# Patient Record
Sex: Female | Born: 1935 | Race: White | Hispanic: No
Health system: Southern US, Community
[De-identification: ages and names within clinical notes are randomized; demographics above are authoritative.]

---

## 2006-01-23 ENCOUNTER — Emergency Department: Payer: Self-pay | Admitting: Internal Medicine

## 2006-06-03 ENCOUNTER — Ambulatory Visit: Payer: Self-pay | Admitting: Specialist

## 2007-02-22 ENCOUNTER — Other Ambulatory Visit: Payer: Self-pay

## 2007-02-22 ENCOUNTER — Ambulatory Visit: Payer: Self-pay | Admitting: Specialist

## 2007-03-11 ENCOUNTER — Inpatient Hospital Stay: Payer: Self-pay | Admitting: Specialist

## 2007-03-23 ENCOUNTER — Ambulatory Visit: Payer: Self-pay | Admitting: Internal Medicine

## 2007-04-13 ENCOUNTER — Other Ambulatory Visit: Payer: Self-pay

## 2007-04-13 ENCOUNTER — Inpatient Hospital Stay: Payer: Self-pay | Admitting: Internal Medicine

## 2007-05-03 ENCOUNTER — Ambulatory Visit: Payer: Self-pay | Admitting: Family Medicine

## 2007-05-04 ENCOUNTER — Ambulatory Visit: Payer: Self-pay | Admitting: Family Medicine

## 2007-05-12 ENCOUNTER — Inpatient Hospital Stay: Payer: Self-pay | Admitting: Internal Medicine

## 2007-05-19 ENCOUNTER — Inpatient Hospital Stay: Payer: Self-pay | Admitting: Internal Medicine

## 2007-05-19 ENCOUNTER — Other Ambulatory Visit: Payer: Self-pay

## 2007-06-02 ENCOUNTER — Other Ambulatory Visit: Payer: Self-pay

## 2007-06-02 ENCOUNTER — Observation Stay: Payer: Self-pay | Admitting: Internal Medicine

## 2007-10-13 ENCOUNTER — Ambulatory Visit: Payer: Self-pay | Admitting: Family Medicine

## 2008-03-03 ENCOUNTER — Ambulatory Visit: Payer: Self-pay | Admitting: Internal Medicine

## 2008-03-21 ENCOUNTER — Ambulatory Visit: Payer: Self-pay | Admitting: Unknown Physician Specialty

## 2009-03-27 ENCOUNTER — Ambulatory Visit: Payer: Self-pay | Admitting: Vascular Surgery

## 2009-04-24 ENCOUNTER — Inpatient Hospital Stay: Payer: Self-pay | Admitting: Internal Medicine

## 2009-08-21 IMAGING — XA IR VASCULAR PROCEDURE
10 of 12 series · 15 of 24 positions shown · non-contrast
Comparison: none

[Series 1: run · 2 of 40 slices shown (1 of 10)]
[im 1/40]
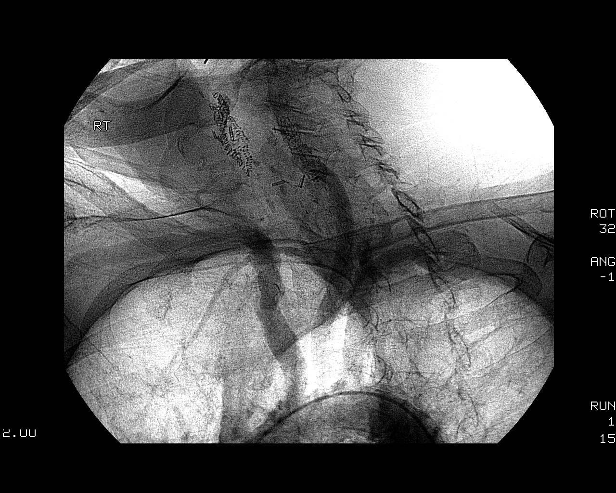
[im 40/40]
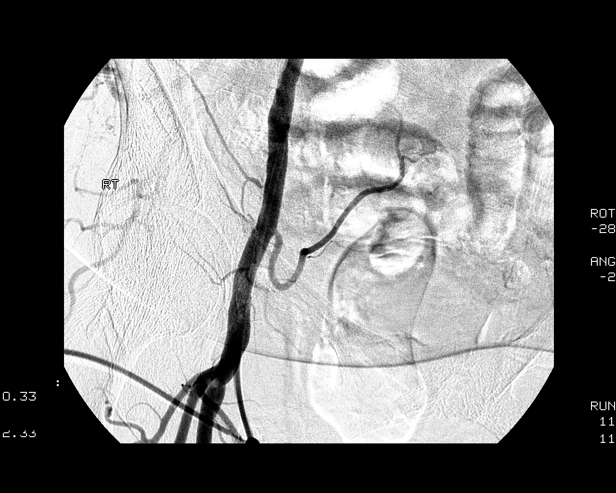

[Series 2: run · 1 of 13 slices shown (2 of 10)]
[im 1/13]
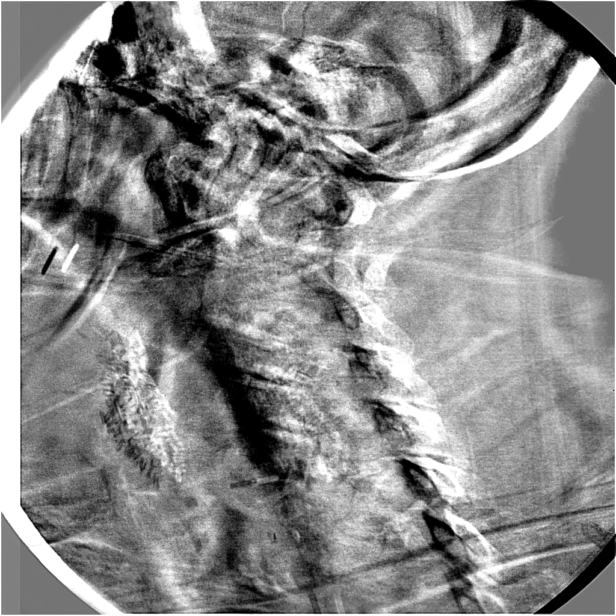

[Series 3: run · 1 of 12 slices shown (3 of 10)]
[im 1/12]
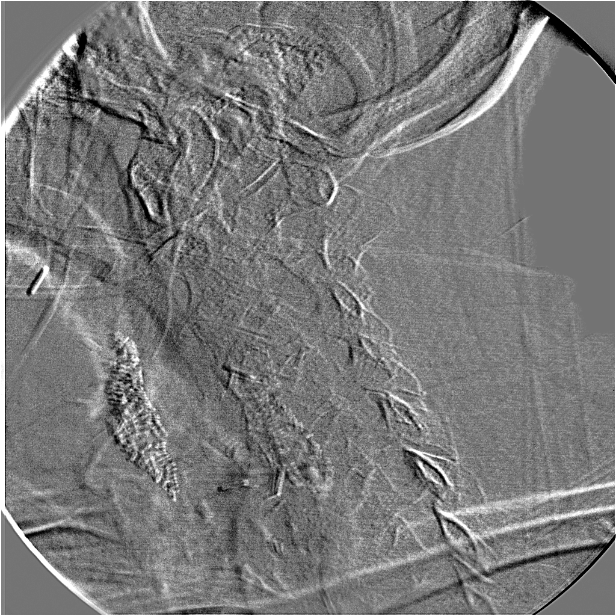

[Series 5: run · 1 of 11 slices shown (4 of 10)]
[im 1/11]
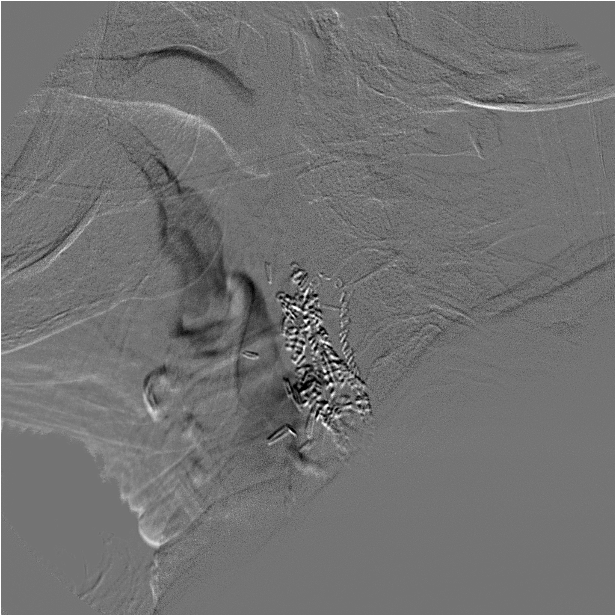

[Series 6: run · 2 of 35 slices shown (5 of 10)]
[im 1/35]
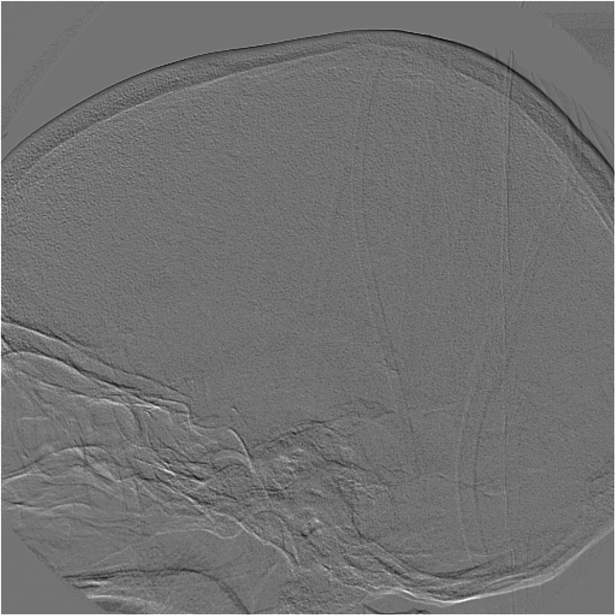
[im 35/35]
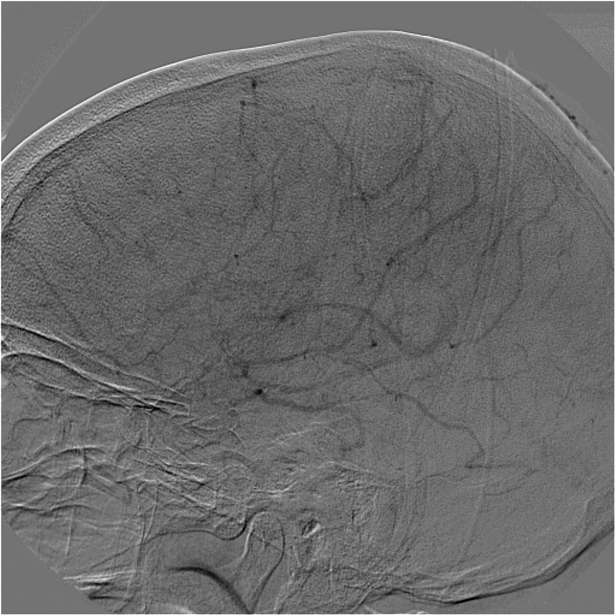

[Series 7: run · 2 of 49 slices shown (6 of 10)]
[im 17/49]
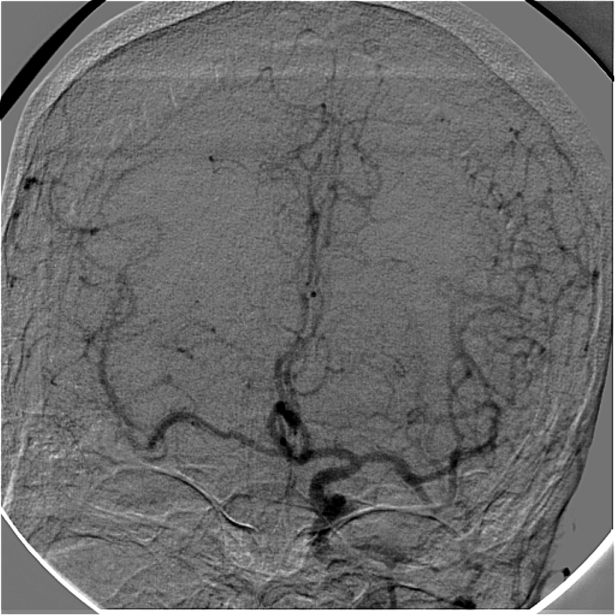
[im 33/49]
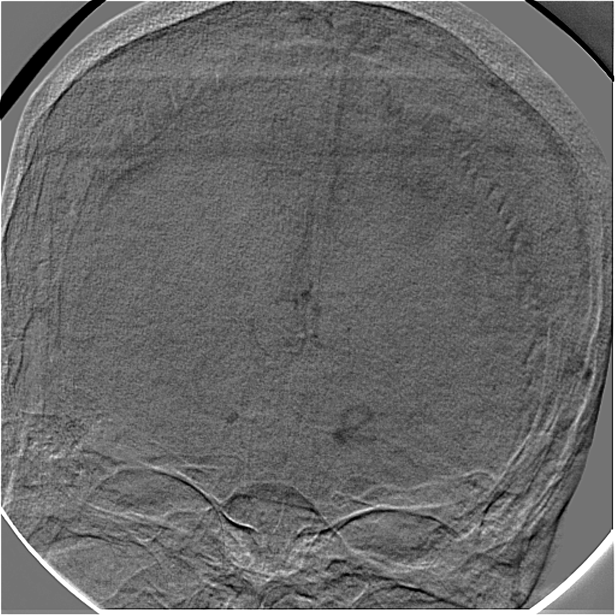

[Series 8: run · 2 of 42 slices shown (7 of 10)]
[im 1/42]
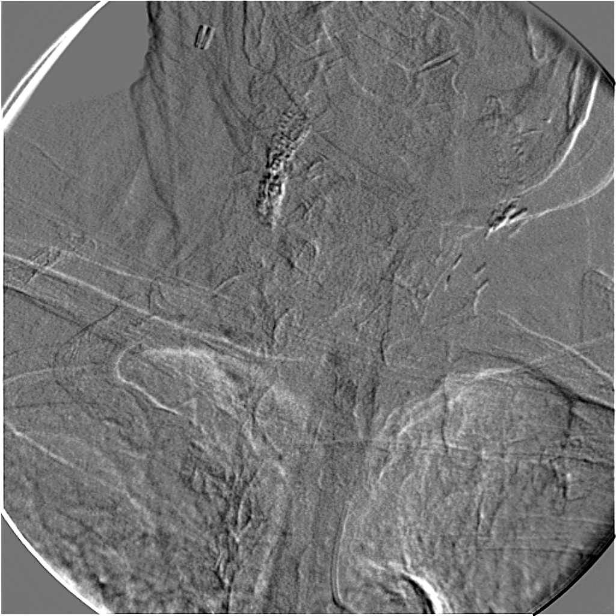
[im 21/42]
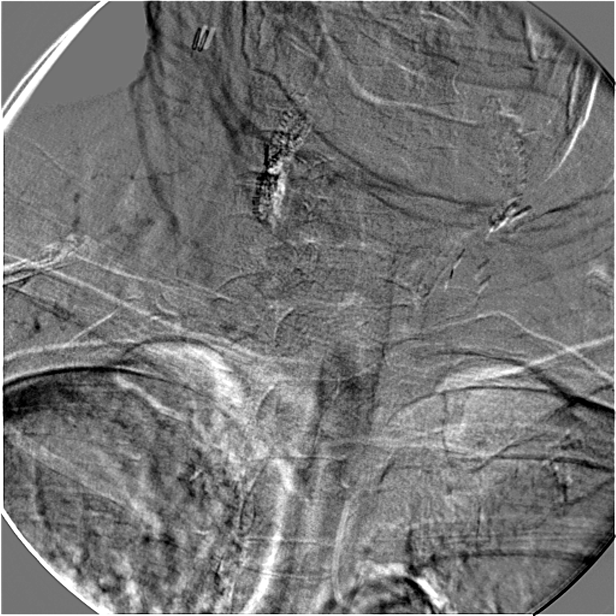

[Series 9: run · 1 of 29 slices shown (8 of 10)]
[im 1/29]
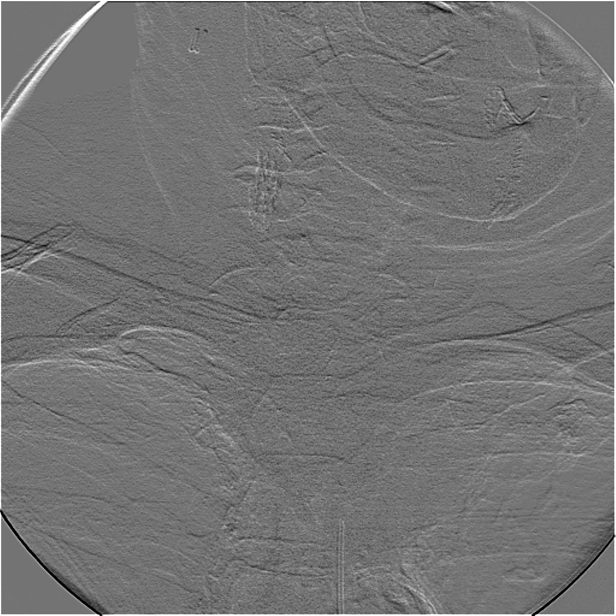

[Series 10: run · 2 of 36 slices shown (9 of 10)]
[im 1/36]
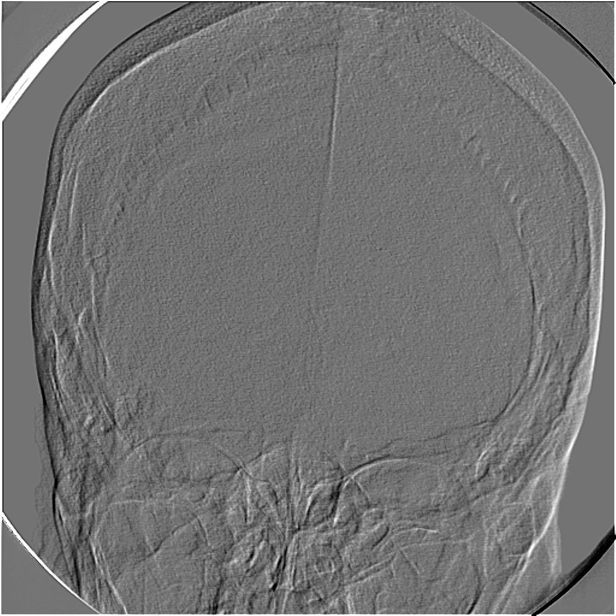
[im 18/36]
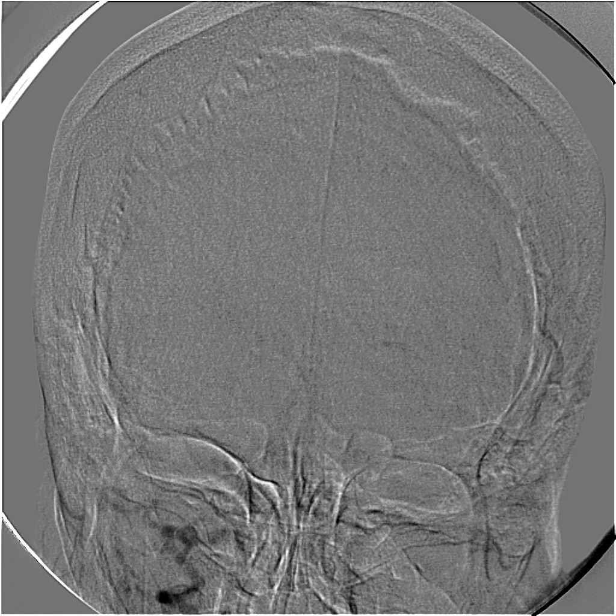

[Series 11: run · 1 of 11 slices shown (10 of 10)]
[im 1/11]
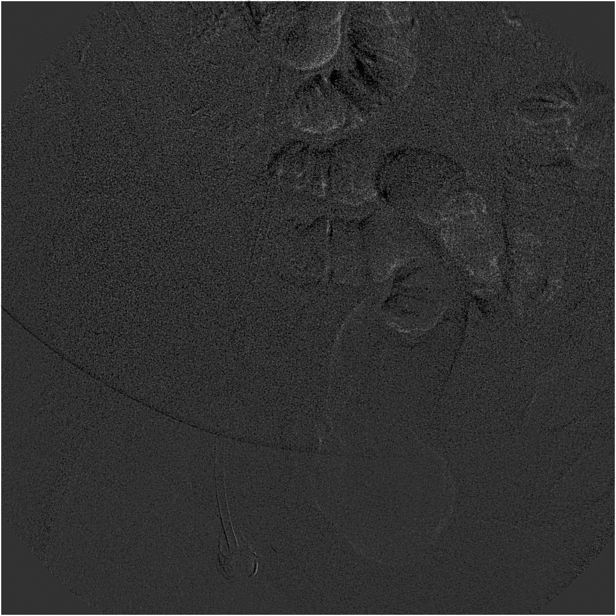

[15 of 24 positions shown; findings below may reference images not displayed]

IMAGES IMPORTED FROM THE [employer] WORKFLOW SYSTEM
NO DICTATION FOR STUDY

## 2011-02-06 ENCOUNTER — Observation Stay: Payer: Self-pay | Admitting: Internal Medicine

## 2011-06-03 ENCOUNTER — Ambulatory Visit: Payer: Self-pay | Admitting: Internal Medicine

## 2011-09-11 LAB — COMPREHENSIVE METABOLIC PANEL
Albumin: 3.6 g/dL (ref 3.4–5.0)
Anion Gap: 8 (ref 7–16)
Calcium, Total: 8.8 mg/dL (ref 8.5–10.1)
Chloride: 104 mmol/L (ref 98–107)
Co2: 32 mmol/L (ref 21–32)
Creatinine: 0.76 mg/dL (ref 0.60–1.30)
EGFR (African American): >60
EGFR (Non-African Amer.): >60
Osmolality: 285 (ref 275–301)
Potassium: 3.9 mmol/L (ref 3.5–5.1)
SGOT(AST): 19 U/L (ref 15–37)
Sodium: 144 mmol/L (ref 136–145)
Total Protein: 6.9 g/dL (ref 6.4–8.2)

## 2011-09-11 LAB — CBC WITH DIFFERENTIAL/PLATELET
Eosinophil #: 0.1 10*3/uL (ref 0.0–0.7)
HCT: 35.4 % (ref 35.0–47.0)
HGB: 11.3 g/dL — ABNORMAL LOW (ref 12.0–16.0)
Lymphocyte %: 15.5 %
MCH: 25.9 pg — ABNORMAL LOW (ref 26.0–34.0)
MCHC: 31.9 g/dL — ABNORMAL LOW (ref 32.0–36.0)
Monocyte #: 0.7 10*3/uL (ref 0.0–0.7)
Neutrophil %: 72.7 %
Platelet: 185 10*3/uL (ref 150–440)
RBC: 4.37 10*6/uL (ref 3.80–5.20)
RDW: 14.1 % (ref 11.5–14.5)
WBC: 6.4 10*3/uL (ref 3.6–11.0)

## 2011-09-11 LAB — PROTIME-INR
INR: 0.9
Prothrombin Time: 12.9 secs (ref 11.5–14.7)

## 2011-09-11 LAB — URINALYSIS, COMPLETE
Leukocyte Esterase: NEGATIVE
Nitrite: NEGATIVE
Protein: NEGATIVE
RBC,UR: 1 /HPF (ref 0–5)
Specific Gravity: 1.02 (ref 1.003–1.030)
WBC UR: 1 /HPF (ref 0–5)

## 2011-09-11 LAB — APTT: Activated PTT: 23 secs — ABNORMAL LOW (ref 23.6–35.9)

## 2011-09-11 LAB — TROPONIN I: Troponin-I: <0.02 ng/mL

## 2011-09-12 ENCOUNTER — Observation Stay: Payer: Self-pay | Admitting: Internal Medicine

## 2011-09-12 LAB — CK TOTAL AND CKMB (NOT AT ARMC)
CK, Total: 53 U/L (ref 21–215)
CK, Total: 57 U/L (ref 21–215)
CK, Total: 89 U/L (ref 21–215)
CK-MB: 2.5 ng/mL (ref 0.5–3.6)

## 2011-09-12 LAB — CBC WITH DIFFERENTIAL/PLATELET
Basophil #: 0 10*3/uL (ref 0.0–0.1)
Basophil %: 0.4 %
Eosinophil #: 0 10*3/uL (ref 0.0–0.7)
HGB: 10.3 g/dL — ABNORMAL LOW (ref 12.0–16.0)
Lymphocyte %: 14.9 %
MCH: 26 pg (ref 26.0–34.0)
MCHC: 32.3 g/dL (ref 32.0–36.0)
MCV: 81 fL (ref 80–100)
Monocyte #: 0.6 10*3/uL (ref 0.0–0.7)
Neutrophil #: 4.1 10*3/uL (ref 1.4–6.5)
Neutrophil %: 72.7 %
RDW: 13.8 % (ref 11.5–14.5)

## 2011-09-12 LAB — TSH: Thyroid Stimulating Horm: 2.14 u[IU]/mL

## 2011-09-12 LAB — BASIC METABOLIC PANEL
BUN: 9 mg/dL (ref 7–18)
Calcium, Total: 8.5 mg/dL (ref 8.5–10.1)
Co2: 32 mmol/L (ref 21–32)
EGFR (African American): >60
EGFR (Non-African Amer.): >60
Glucose: 129 mg/dL — ABNORMAL HIGH (ref 65–99)
Osmolality: 285 (ref 275–301)
Potassium: 4.1 mmol/L (ref 3.5–5.1)
Sodium: 143 mmol/L (ref 136–145)

## 2011-09-12 LAB — HEMOGLOBIN A1C: Hemoglobin A1C: 5.9 % (ref 4.2–6.3)

## 2011-09-12 LAB — LIPID PANEL
Cholesterol: 116 mg/dL (ref 0–200)
HDL Cholesterol: 48 mg/dL (ref 40–60)
Triglycerides: 81 mg/dL (ref 0–200)
VLDL Cholesterol, Calc: 16 mg/dL (ref 5–40)

## 2011-09-12 LAB — MAGNESIUM: Magnesium: 1.9 mg/dL

## 2011-09-12 LAB — TROPONIN I: Troponin-I: <0.02 ng/mL

## 2011-10-10 ENCOUNTER — Ambulatory Visit: Payer: Self-pay | Admitting: Physical Medicine and Rehabilitation

## 2012-05-04 LAB — URINALYSIS, COMPLETE
Bilirubin,UR: NEGATIVE
Hyaline Cast: 3
Leukocyte Esterase: NEGATIVE
Ph: 5 (ref 4.5–8.0)
Protein: 100
RBC,UR: 1 /HPF (ref 0–5)
Squamous Epithelial: 1

## 2012-05-04 LAB — COMPREHENSIVE METABOLIC PANEL
Albumin: 3.6 g/dL (ref 3.4–5.0)
Alkaline Phosphatase: 76 U/L (ref 50–136)
Bilirubin,Total: 0.4 mg/dL (ref 0.2–1.0)
Creatinine: 0.88 mg/dL (ref 0.60–1.30)
Glucose: 126 mg/dL — ABNORMAL HIGH (ref 65–99)
Osmolality: 286 (ref 275–301)
SGPT (ALT): 16 U/L (ref 12–78)
Sodium: 143 mmol/L (ref 136–145)

## 2012-05-04 LAB — CK TOTAL AND CKMB (NOT AT ARMC)
CK, Total: 56 U/L (ref 21–215)
CK-MB: 1.9 ng/mL (ref 0.5–3.6)

## 2012-05-04 LAB — CBC
HGB: 11.7 g/dL — ABNORMAL LOW (ref 12.0–16.0)
MCH: 25.8 pg — ABNORMAL LOW (ref 26.0–34.0)
MCHC: 32 g/dL (ref 32.0–36.0)

## 2012-05-05 ENCOUNTER — Inpatient Hospital Stay: Payer: Self-pay | Admitting: Internal Medicine

## 2012-05-06 LAB — BASIC METABOLIC PANEL
Anion Gap: 7 (ref 7–16)
Calcium, Total: 8.1 mg/dL — ABNORMAL LOW (ref 8.5–10.1)
Chloride: 112 mmol/L — ABNORMAL HIGH (ref 98–107)
Co2: 31 mmol/L (ref 21–32)
Creatinine: 0.71 mg/dL (ref 0.60–1.30)
EGFR (Non-African Amer.): >60
Osmolality: 295 (ref 275–301)
Sodium: 150 mmol/L — ABNORMAL HIGH (ref 136–145)

## 2012-05-06 LAB — CBC WITH DIFFERENTIAL/PLATELET
Basophil #: 0 10*3/uL (ref 0.0–0.1)
Basophil #: 0 10*3/uL (ref 0.0–0.1)
Eosinophil #: 0.1 10*3/uL (ref 0.0–0.7)
HCT: 31.9 % — ABNORMAL LOW (ref 35.0–47.0)
HGB: 10.4 g/dL — ABNORMAL LOW (ref 12.0–16.0)
Lymphocyte #: 0.8 10*3/uL — ABNORMAL LOW (ref 1.0–3.6)
Lymphocyte #: 0.7 10*3/uL — ABNORMAL LOW (ref 1.0–3.6)
Lymphocyte %: 26.5 %
Lymphocyte %: 13.1 %
MCHC: 33.4 g/dL (ref 32.0–36.0)
MCHC: 32.6 g/dL (ref 32.0–36.0)
MCV: 80 fL (ref 80–100)
MCV: 80 fL (ref 80–100)
Monocyte %: 11.7 %
Neutrophil #: 1.8 10*3/uL (ref 1.4–6.5)
Neutrophil #: 4.4 10*3/uL (ref 1.4–6.5)
Neutrophil %: 59 %
Neutrophil %: 77.5 %
Platelet: 112 10*3/uL — ABNORMAL LOW (ref 150–440)
RBC: 3.67 10*6/uL — ABNORMAL LOW (ref 3.80–5.20)
RDW: 14.8 % — ABNORMAL HIGH (ref 11.5–14.5)
RDW: 14.9 % — ABNORMAL HIGH (ref 11.5–14.5)
WBC: 3 10*3/uL — ABNORMAL LOW (ref 3.6–11.0)
WBC: 5.7 10*3/uL (ref 3.6–11.0)

## 2012-05-06 LAB — URINE CULTURE

## 2012-05-07 LAB — COMPREHENSIVE METABOLIC PANEL
Albumin: 3 g/dL — ABNORMAL LOW (ref 3.4–5.0)
Alkaline Phosphatase: 58 U/L (ref 50–136)
BUN: 5 mg/dL — ABNORMAL LOW (ref 7–18)
Calcium, Total: 8.4 mg/dL — ABNORMAL LOW (ref 8.5–10.1)
Chloride: 105 mmol/L (ref 98–107)
EGFR (African American): >60
Glucose: 100 mg/dL — ABNORMAL HIGH (ref 65–99)
Osmolality: 286 (ref 275–301)
Potassium: 3.6 mmol/L (ref 3.5–5.1)
Sodium: 145 mmol/L (ref 136–145)
Total Protein: 6.1 g/dL — ABNORMAL LOW (ref 6.4–8.2)

## 2012-05-07 LAB — CBC WITH DIFFERENTIAL/PLATELET
Basophil #: 0 10*3/uL (ref 0.0–0.1)
Basophil %: 0.2 %
Eosinophil #: 0.1 10*3/uL (ref 0.0–0.7)
HCT: 30.9 % — ABNORMAL LOW (ref 35.0–47.0)
Lymphocyte #: 1 10*3/uL (ref 1.0–3.6)
MCV: 80 fL (ref 80–100)
Monocyte #: 0.5 x10 3/mm (ref 0.2–0.9)
Monocyte %: 11.2 %
Neutrophil #: 2.8 10*3/uL (ref 1.4–6.5)
Platelet: 131 10*3/uL — ABNORMAL LOW (ref 150–440)
RDW: 14.6 % — ABNORMAL HIGH (ref 11.5–14.5)
WBC: 4.3 10*3/uL (ref 3.6–11.0)

## 2012-05-07 LAB — MAGNESIUM: Magnesium: 1.6 mg/dL — ABNORMAL LOW

## 2012-05-10 LAB — CULTURE, BLOOD (SINGLE)

## 2012-05-17 ENCOUNTER — Inpatient Hospital Stay: Payer: Self-pay | Admitting: Internal Medicine

## 2012-05-17 LAB — URINALYSIS, COMPLETE
Bacteria: NONE SEEN
Bilirubin,UR: NEGATIVE
Blood: NEGATIVE
Glucose,UR: NEGATIVE mg/dL (ref 0–75)
Ketone: NEGATIVE
Protein: NEGATIVE
Specific Gravity: 1.012 (ref 1.003–1.030)
Squamous Epithelial: 1
WBC UR: 3 /HPF (ref 0–5)

## 2012-05-17 LAB — COMPREHENSIVE METABOLIC PANEL
Albumin: 3.7 g/dL (ref 3.4–5.0)
Anion Gap: 5 — ABNORMAL LOW (ref 7–16)
BUN: 9 mg/dL (ref 7–18)
Bilirubin,Total: 0.4 mg/dL (ref 0.2–1.0)
Chloride: 100 mmol/L (ref 98–107)
Creatinine: 0.82 mg/dL (ref 0.60–1.30)
EGFR (African American): >60
Glucose: 110 mg/dL — ABNORMAL HIGH (ref 65–99)
Potassium: 4.2 mmol/L (ref 3.5–5.1)
SGOT(AST): 21 U/L (ref 15–37)
SGPT (ALT): 15 U/L (ref 12–78)
Sodium: 140 mmol/L (ref 136–145)
Total Protein: 7.4 g/dL (ref 6.4–8.2)

## 2012-05-17 LAB — CBC WITH DIFFERENTIAL/PLATELET
Basophil %: 0.2 %
Eosinophil #: 0.1 10*3/uL (ref 0.0–0.7)
Eosinophil %: 0.8 %
HCT: 34.7 % — ABNORMAL LOW (ref 35.0–47.0)
HGB: 11.2 g/dL — ABNORMAL LOW (ref 12.0–16.0)
Lymphocyte #: 0.9 10*3/uL — ABNORMAL LOW (ref 1.0–3.6)
MCH: 26.3 pg (ref 26.0–34.0)
MCHC: 32.4 g/dL (ref 32.0–36.0)
MCV: 81 fL (ref 80–100)
Monocyte #: 0.7 x10 3/mm (ref 0.2–0.9)
Monocyte %: 9.1 %
Neutrophil #: 5.7 10*3/uL (ref 1.4–6.5)
Platelet: 172 10*3/uL (ref 150–440)
RBC: 4.27 10*6/uL (ref 3.80–5.20)
WBC: 7.3 10*3/uL (ref 3.6–11.0)

## 2012-05-17 LAB — TSH: Thyroid Stimulating Horm: 0.712 u[IU]/mL

## 2012-05-19 LAB — URINE CULTURE

## 2012-05-23 LAB — CULTURE, BLOOD (SINGLE)

## 2013-03-03 ENCOUNTER — Ambulatory Visit: Payer: Self-pay | Admitting: Internal Medicine

## 2013-03-07 ENCOUNTER — Other Ambulatory Visit: Payer: Self-pay | Admitting: Gastroenterology

## 2013-03-07 DIAGNOSIS — R112 Nausea with vomiting, unspecified: Secondary | ICD-10-CM

## 2013-03-07 DIAGNOSIS — R1310 Dysphagia, unspecified: Secondary | ICD-10-CM

## 2013-03-07 DIAGNOSIS — R109 Unspecified abdominal pain: Secondary | ICD-10-CM

## 2013-03-09 ENCOUNTER — Other Ambulatory Visit: Payer: Self-pay | Admitting: Gastroenterology

## 2013-03-09 ENCOUNTER — Ambulatory Visit
Admission: RE | Admit: 2013-03-09 | Discharge: 2013-03-09 | Disposition: A | Discharge status: Home / Self Care | Payer: Medicare Other | Source: Ambulatory Visit | Attending: Gastroenterology | Admitting: Gastroenterology

## 2013-03-09 DIAGNOSIS — R109 Unspecified abdominal pain: Secondary | ICD-10-CM

## 2013-03-09 DIAGNOSIS — R112 Nausea with vomiting, unspecified: Secondary | ICD-10-CM

## 2013-03-09 DIAGNOSIS — R1310 Dysphagia, unspecified: Secondary | ICD-10-CM

## 2013-04-13 ENCOUNTER — Ambulatory Visit: Payer: Self-pay | Admitting: Gastroenterology

## 2013-05-06 ENCOUNTER — Emergency Department: Payer: Self-pay | Admitting: Emergency Medicine

## 2013-05-06 ENCOUNTER — Ambulatory Visit: Payer: Self-pay | Admitting: Internal Medicine

## 2013-05-06 LAB — URINALYSIS, COMPLETE
Bilirubin,UR: NEGATIVE
Ketone: NEGATIVE
Nitrite: POSITIVE
Ph: 6 (ref 4.5–8.0)
RBC,UR: 7 /HPF (ref 0–5)
Specific Gravity: 1.005 (ref 1.003–1.030)
Squamous Epithelial: 5
WBC UR: 155 /HPF (ref 0–5)

## 2013-05-06 LAB — COMPREHENSIVE METABOLIC PANEL
Albumin: 3.4 g/dL (ref 3.4–5.0)
Anion Gap: 4 — ABNORMAL LOW (ref 7–16)
BUN: 7 mg/dL (ref 7–18)
Chloride: 105 mmol/L (ref 98–107)
Glucose: 121 mg/dL — ABNORMAL HIGH (ref 65–99)
Osmolality: 277 (ref 275–301)
SGOT(AST): 23 U/L (ref 15–37)

## 2013-05-06 LAB — CBC
HCT: 35.5 % (ref 35.0–47.0)
HGB: 11.7 g/dL — ABNORMAL LOW (ref 12.0–16.0)
MCHC: 33.1 g/dL (ref 32.0–36.0)
Platelet: 177 10*3/uL (ref 150–440)
RBC: 4.54 10*6/uL (ref 3.80–5.20)
RDW: 15.4 % — ABNORMAL HIGH (ref 11.5–14.5)

## 2013-05-06 LAB — LIPASE, BLOOD: Lipase: 32 U/L — ABNORMAL LOW (ref 73–393)

## 2013-11-19 ENCOUNTER — Inpatient Hospital Stay: Payer: Self-pay | Admitting: Internal Medicine

## 2013-11-19 LAB — COMPREHENSIVE METABOLIC PANEL
ALK PHOS: 73 U/L
ALT: 17 U/L (ref 12–78)
ANION GAP: 2 — AB (ref 7–16)
Albumin: 3.8 g/dL (ref 3.4–5.0)
BUN: 11 mg/dL (ref 7–18)
Bilirubin,Total: 0.5 mg/dL (ref 0.2–1.0)
CO2: 33 mmol/L — AB (ref 21–32)
Calcium, Total: 8.9 mg/dL (ref 8.5–10.1)
Chloride: 102 mmol/L (ref 98–107)
Creatinine: 0.72 mg/dL (ref 0.60–1.30)
EGFR (African American): >60
EGFR (Non-African Amer.): >60
GLUCOSE: 178 mg/dL — AB (ref 65–99)
OSMOLALITY: 278 (ref 275–301)
POTASSIUM: 3.6 mmol/L (ref 3.5–5.1)
SGOT(AST): 21 U/L (ref 15–37)
Sodium: 137 mmol/L (ref 136–145)
TOTAL PROTEIN: 7.4 g/dL (ref 6.4–8.2)

## 2013-11-19 LAB — URINALYSIS, COMPLETE
BACTERIA: NONE SEEN
Bilirubin,UR: NEGATIVE
Glucose,UR: NEGATIVE mg/dL (ref 0–75)
Hyaline Cast: 15
Ketone: NEGATIVE
Leukocyte Esterase: NEGATIVE
Nitrite: NEGATIVE
PH: 5 (ref 4.5–8.0)
Protein: NEGATIVE
RBC,UR: 3 /HPF (ref 0–5)
Specific Gravity: 1.014 (ref 1.003–1.030)
Squamous Epithelial: 1
WBC UR: 4 /HPF (ref 0–5)

## 2013-11-19 LAB — CBC WITH DIFFERENTIAL/PLATELET
Basophil #: 0 10*3/uL (ref 0.0–0.1)
Basophil %: 0.1 %
EOS PCT: 0.2 %
Eosinophil #: 0 10*3/uL (ref 0.0–0.7)
HCT: 37.9 % (ref 35.0–47.0)
HGB: 11.9 g/dL — ABNORMAL LOW (ref 12.0–16.0)
Lymphocyte #: 0.9 10*3/uL — ABNORMAL LOW (ref 1.0–3.6)
Lymphocyte %: 5.2 %
MCH: 24.7 pg — AB (ref 26.0–34.0)
MCHC: 31.5 g/dL — AB (ref 32.0–36.0)
MCV: 79 fL — AB (ref 80–100)
Monocyte #: 1.6 x10 3/mm — ABNORMAL HIGH (ref 0.2–0.9)
Monocyte %: 8.6 %
NEUTROS PCT: 85.9 %
Neutrophil #: 15.5 10*3/uL — ABNORMAL HIGH (ref 1.4–6.5)
Platelet: 183 10*3/uL (ref 150–440)
RBC: 4.82 10*6/uL (ref 3.80–5.20)
RDW: 16.8 % — AB (ref 11.5–14.5)
WBC: 18 10*3/uL — ABNORMAL HIGH (ref 3.6–11.0)

## 2013-11-19 LAB — LIPASE, BLOOD: LIPASE: 40 U/L — AB (ref 73–393)

## 2013-11-19 LAB — TROPONIN I: Troponin-I: <0.02 ng/mL

## 2013-11-21 LAB — CLOSTRIDIUM DIFFICILE(ARMC)

## 2013-11-22 LAB — CBC WITH DIFFERENTIAL/PLATELET
Basophil #: 0 10*3/uL (ref 0.0–0.1)
Basophil %: 0.2 %
EOS ABS: 0 10*3/uL (ref 0.0–0.7)
Eosinophil %: 0 %
HCT: 29.5 % — ABNORMAL LOW (ref 35.0–47.0)
HGB: 9.6 g/dL — ABNORMAL LOW (ref 12.0–16.0)
LYMPHS PCT: 9.9 %
Lymphocyte #: 0.4 10*3/uL — ABNORMAL LOW (ref 1.0–3.6)
MCH: 25.2 pg — ABNORMAL LOW (ref 26.0–34.0)
MCHC: 32.7 g/dL (ref 32.0–36.0)
MCV: 77 fL — ABNORMAL LOW (ref 80–100)
MONO ABS: 0.4 x10 3/mm (ref 0.2–0.9)
Monocyte %: 9.8 %
Neutrophil #: 3.3 10*3/uL (ref 1.4–6.5)
Neutrophil %: 80.1 %
Platelet: 126 10*3/uL — ABNORMAL LOW (ref 150–440)
RBC: 3.83 10*6/uL (ref 3.80–5.20)
RDW: 15.9 % — ABNORMAL HIGH (ref 11.5–14.5)
WBC: 4.1 10*3/uL (ref 3.6–11.0)

## 2013-11-22 LAB — BASIC METABOLIC PANEL
Anion Gap: 4 — ABNORMAL LOW (ref 7–16)
BUN: 7 mg/dL (ref 7–18)
CHLORIDE: 105 mmol/L (ref 98–107)
CO2: 32 mmol/L (ref 21–32)
CREATININE: 0.44 mg/dL — AB (ref 0.60–1.30)
Calcium, Total: 9 mg/dL (ref 8.5–10.1)
EGFR (African American): >60
GLUCOSE: 113 mg/dL — AB (ref 65–99)
Osmolality: 280 (ref 275–301)
Potassium: 3.7 mmol/L (ref 3.5–5.1)
Sodium: 141 mmol/L (ref 136–145)

## 2014-02-05 ENCOUNTER — Inpatient Hospital Stay: Payer: Self-pay | Admitting: Internal Medicine

## 2014-02-05 LAB — CBC
HCT: 36.7 % (ref 35.0–47.0)
HGB: 11.4 g/dL — ABNORMAL LOW (ref 12.0–16.0)
MCH: 24.1 pg — AB (ref 26.0–34.0)
MCHC: 31.2 g/dL — AB (ref 32.0–36.0)
MCV: 77 fL — ABNORMAL LOW (ref 80–100)
Platelet: 207 10*3/uL (ref 150–440)
RBC: 4.75 10*6/uL (ref 3.80–5.20)
RDW: 16 % — ABNORMAL HIGH (ref 11.5–14.5)
WBC: 8.7 10*3/uL (ref 3.6–11.0)

## 2014-02-05 LAB — COMPREHENSIVE METABOLIC PANEL
AST: 16 U/L (ref 15–37)
Albumin: 3.7 g/dL (ref 3.4–5.0)
Alkaline Phosphatase: 71 U/L
Anion Gap: 7 (ref 7–16)
BILIRUBIN TOTAL: 0.4 mg/dL (ref 0.2–1.0)
BUN: 8 mg/dL (ref 7–18)
Calcium, Total: 9.3 mg/dL (ref 8.5–10.1)
Chloride: 100 mmol/L (ref 98–107)
Co2: 29 mmol/L (ref 21–32)
Creatinine: 0.89 mg/dL (ref 0.60–1.30)
GFR CALC NON AF AMER: 58 — AB
Glucose: 161 mg/dL — ABNORMAL HIGH (ref 65–99)
Osmolality: 274 (ref 275–301)
Potassium: 3.7 mmol/L (ref 3.5–5.1)
SGPT (ALT): 15 U/L (ref 12–78)
SODIUM: 136 mmol/L (ref 136–145)
TOTAL PROTEIN: 7.3 g/dL (ref 6.4–8.2)

## 2014-02-05 LAB — TROPONIN I

## 2014-02-05 LAB — PRO B NATRIURETIC PEPTIDE: B-Type Natriuretic Peptide: 129 pg/mL (ref 0–450)

## 2014-02-06 LAB — BASIC METABOLIC PANEL
ANION GAP: 5 — AB (ref 7–16)
BUN: 8 mg/dL (ref 7–18)
CHLORIDE: 99 mmol/L (ref 98–107)
Calcium, Total: 9 mg/dL (ref 8.5–10.1)
Co2: 31 mmol/L (ref 21–32)
Creatinine: 1.12 mg/dL (ref 0.60–1.30)
GFR CALC AF AMER: 51 — AB
GFR CALC NON AF AMER: 44 — AB
GLUCOSE: 216 mg/dL — AB (ref 65–99)
Osmolality: 275 (ref 275–301)
POTASSIUM: 3.9 mmol/L (ref 3.5–5.1)
Sodium: 135 mmol/L — ABNORMAL LOW (ref 136–145)

## 2014-02-06 LAB — CBC WITH DIFFERENTIAL/PLATELET
BASOS PCT: 0.1 %
Basophil #: 0 10*3/uL (ref 0.0–0.1)
Eosinophil #: 0 10*3/uL (ref 0.0–0.7)
Eosinophil %: 0 %
HCT: 33.4 % — ABNORMAL LOW (ref 35.0–47.0)
HGB: 10.4 g/dL — ABNORMAL LOW (ref 12.0–16.0)
Lymphocyte #: 0.2 10*3/uL — ABNORMAL LOW (ref 1.0–3.6)
Lymphocyte %: 4.3 %
MCH: 24.1 pg — ABNORMAL LOW (ref 26.0–34.0)
MCHC: 31.1 g/dL — ABNORMAL LOW (ref 32.0–36.0)
MCV: 78 fL — AB (ref 80–100)
Monocyte #: 0.1 x10 3/mm — ABNORMAL LOW (ref 0.2–0.9)
Monocyte %: 1.7 %
Neutrophil #: 3.4 10*3/uL (ref 1.4–6.5)
Neutrophil %: 93.9 %
PLATELETS: 189 10*3/uL (ref 150–440)
RBC: 4.31 10*6/uL (ref 3.80–5.20)
RDW: 15.6 % — AB (ref 11.5–14.5)
WBC: 3.6 10*3/uL (ref 3.6–11.0)

## 2014-02-08 ENCOUNTER — Ambulatory Visit
Admit: 2014-02-08 | Disposition: A | Discharge status: Home / Self Care | Payer: Self-pay | Attending: Nurse Practitioner | Admitting: Nurse Practitioner

## 2014-02-09 LAB — CBC WITH DIFFERENTIAL/PLATELET
Basophil #: 0 10*3/uL (ref 0.0–0.1)
Basophil %: 0.1 %
EOS ABS: 0 10*3/uL (ref 0.0–0.7)
EOS PCT: 0 %
HCT: 31.7 % — AB (ref 35.0–47.0)
HGB: 10.2 g/dL — AB (ref 12.0–16.0)
LYMPHS ABS: 0.6 10*3/uL — AB (ref 1.0–3.6)
Lymphocyte %: 9.3 %
MCH: 24.8 pg — ABNORMAL LOW (ref 26.0–34.0)
MCHC: 32.1 g/dL (ref 32.0–36.0)
MCV: 77 fL — ABNORMAL LOW (ref 80–100)
Monocyte #: 0.6 x10 3/mm (ref 0.2–0.9)
Monocyte %: 9.8 %
NEUTROS PCT: 80.8 %
Neutrophil #: 4.9 10*3/uL (ref 1.4–6.5)
PLATELETS: 156 10*3/uL (ref 150–440)
RBC: 4.11 10*6/uL (ref 3.80–5.20)
RDW: 15.8 % — ABNORMAL HIGH (ref 11.5–14.5)
WBC: 6 10*3/uL (ref 3.6–11.0)

## 2014-02-09 LAB — BASIC METABOLIC PANEL
ANION GAP: 6 — AB (ref 7–16)
BUN: 16 mg/dL (ref 7–18)
CHLORIDE: 100 mmol/L (ref 98–107)
CO2: 34 mmol/L — AB (ref 21–32)
Calcium, Total: 9.3 mg/dL (ref 8.5–10.1)
Creatinine: 0.67 mg/dL (ref 0.60–1.30)
EGFR (African American): >60
EGFR (Non-African Amer.): >60
Glucose: 116 mg/dL — ABNORMAL HIGH (ref 65–99)
Osmolality: 282 (ref 275–301)
Potassium: 3.6 mmol/L (ref 3.5–5.1)
SODIUM: 140 mmol/L (ref 136–145)

## 2014-02-10 ENCOUNTER — Encounter: Payer: Self-pay | Admitting: Internal Medicine

## 2014-02-10 LAB — CULTURE, BLOOD (SINGLE)

## 2014-03-11 DEATH — deceased

## 2014-11-28 NOTE — Consult Note (Signed)
Brief Consult Note: Diagnosis: Nausea and vomiting.   Patient was seen by consultant.   Comments: Nausea and vomiting with ? low grade fever. Resolved. Tolerating food well. Recent h/o UTI.  Impression: ? Viral gastritis. Other etiologies are possible as well although due to her end stage COPD an EGD is not without risks.  Recommendations: Will check lipase. PPI. Will continue to observe. If no further nausea and vomiting over next 24 hours, may go home with OP follow up. Will follow.  Electronic Signatures: Lurline DelIftikhar, Willia Lampert (MD)  (Signed 08-Oct-13 16:41)  Authored: Brief Consult Note   Last Updated: 08-Oct-13 16:41 by Lurline DelIftikhar, Marrietta Thunder (MD)

## 2014-11-28 NOTE — Discharge Summary (Signed)
PATIENT NAME:  Pamela Cooper, Pamela Cooper DATE OF BIRTH:  01/20/1925  DATE OF ADMISSION:  05/05/2012 DATE OF DISCHARGE:  05/07/2012  DISCHARGE DIAGNOSES: 1. Symptomatic hypokalemia with weakness. 2. Acute cystitis.  3. Malignant hypertension.  4. Chronic obstructive pulmonary disease, oxygen dependent.  5. Coronary artery disease.  6. Chronic anemia.  7. Gastroesophageal reflux disease.   DISCHARGE MEDICATIONS:  1. Xanax 0.25 mg b.i.d.  2. Losartan 100 mg daily.  3. Simvastatin 80 mg at bedtime.  4. Omeprazole 20 mg daily.  5. Reglan 5 mg q.a.c., at bedtime.  6. Aspirin 81 mg daily.  7. Norco 5/325 q.6 p.r.n.  8. Vitamin D3 1000 units daily.  9. Zoloft 50 mg daily.  10. Hydralazine 25 mg b.i.d.  11. 3 liters O2 nasal cannula.  12. Potassium chloride 10 mEq b.i.d.  13. Ceftin 250 mg b.i.d. x5 days.   REASON FOR ADMISSION: 81 year old female presents with profound weakness. Please see history and physical for history of present illness, past medical history, physical exam.   HOSPITAL COURSE: Patient was admitted. Potassium replaced IV and then orally. Urinary tract infection treated with IV Rocephin. She received physical therapy. She is very weak. Symptomatically improved. Started having diarrhea and that was treated with Imodium. Blood pressure went up to 220/80 and that was aggressively treated with progressive hydralazine with good results. Diarrhea resolved, strength improved, although she is still pretty weak. Will get home health nursing and physical therapy. Overall prognosis is guarded.  ____________________________ Danella PentonMark F. Braheem Tomasik, MD mfm:cms D: 05/07/2012 07:20:18 ET T: 05/07/2012 12:18:05 ET JOB#: [id number]  cc: Danella PentonMark F. Roverto Bodmer, MD, <Dictator>  Lateasha Breuer Sherlene ShamsF Estell Dillinger MD ELECTRONICALLY SIGNED 05/10/2012 8:00

## 2014-11-28 NOTE — H&P (Signed)
PATIENT NAME:  Pamela Cooper, Pamela Cooper MR#:  [id number] DATE OF BIRTH:  [birth year]  DATE OF ADMISSION:  05/05/2012  PRIMARY CARE PHYSICIAN: Dr. Bethann Punches. REFERRING ER PHYSICIAN: Dr. Sharma Covert.   CHIEF COMPLAINT: Near syncope, lightheadedness, abdominal pain.   HISTORY OF PRESENT ILLNESS: Pamela Cooper is an 81 year old pleasant Caucasian female. She was in her usual state of health until Sunday, a few days ago, when she fell and she hurt her head. The patient was seen at [facility] and examined by Dr. Hyacinth Meeker for her complaints of feeling nausea, lightheadedness, and feeling unwell. The daughter reports to me that Dr. Hyacinth Meeker suspected she may have underlying infection and also he wanted CT scan of the head to be done. While the patient was waiting in his clinic she went to the bathroom and she almost passed out. She was referred to the hospital for further evaluation. Work-up here including CT scan of the head showed no acute intracranial abnormalities. CT scan of the abdomen was done, which was unremarkable except for some nonspecific bowel wall thickening involving ascending colon. She was also found to have significant hypokalemia and also urinary tract infection. There is a question about chest x-ray if she has interstitial pneumonitis or chronic interstitial changes. However, her presentation is not consistent with pneumonia. She has no cough and no pulmonary symptoms. She is on chronic oxygen use for chronic obstructive pulmonary disease. The patient was admitted for further evaluation and management.   REVIEW OF SYSTEMS: CONSTITUTIONAL: She reports chills, but unsure if she had fever or not. She has generalized weakness and fatigue. EYES: No blurring of vision. No double vision. ENT: No hearing impairment. No sore throat. No dysphagia. CARDIOVASCULAR: No chest pain. No shortness of breath, other than her chronic shortness of breath, but nothing acute. She had near syncope earlier. RESPIRATORY: No cough. No  sputum production. No chest pain. No hemoptysis. GASTROINTESTINAL: Reports abdominal pain located at the suprapubic and periumbilical area along with some back pain. She has nausea but no vomiting, no diarrhea. No hematochezia. No melena. GENITOURINARY: No dysuria or frequency of urination, but she has suprapubic pain and back pain. MUSCULOSKELETAL: No joint pain or swelling. No muscular pain or swelling. INTEGUMENTARY: No skin rash. No ulcers. NEUROLOGY: No focal weakness. No seizure activity. No headache. PSYCHIATRY: No anxiety. No depression other than by history. She has anxiety. ENDOCRINE: No polyuria or polydipsia. No heat or cold intolerance.   PAST MEDICAL HISTORY:  1. History of systemic hypertension.  2. Chronic obstructive pulmonary disease, home oxygen dependent, chronically on 2 liters at rest and 3 liters on exertion.  3. Coronary disease status post myocardial infarction in the past.  4. Chronic anemia.  5. Gastroesophageal reflux disease.  6. Anxiety and depression. 7. Peripheral vascular disease, status post bilateral carotid endarterectomies.  8. Hyperlipidemia.  9. Idiopathic peripheral neuropathy.  10. History of shingles.  11. History of transient ischemic attack.  12. History of breast cancer, status post left mastectomy.   PAST SURGICAL HISTORY:  1. Appendectomy.  2. Hysterectomy.  3. Cholecystectomy.  4. Bilateral carotid endarterectomy. 5. Right knee replacement. 6. Carpal tunnel repair.  7. History of esophageal dilatation with history of foreign body removal using esophagogastroduodenoscopy.   SOCIAL HABITS: Nonsmoker, but she has remote history of smoking. She quit tobacco about 30 years ago. No history of alcohol abuse.   SOCIAL HISTORY: She is widowed. Lives at home, cared for by her daughter who has the power of attorney.   FAMILY  HISTORY: She has a sister who died from leukemia. A brother who died from metastatic prostate cancer. There is family history  also of coronary artery disease, diabetes and cerebrovascular accident.   ADMISSION MEDICATIONS:  1. Xanax 0.25 mg twice a day. 2. Zoloft 50 mg once a day.  3. Vitamin D3, 1000 units once a day. 4. Simvastatin 80 mg once a day. 5. Reglan 5 mg 4 times a day.  6. Reclast 5 mg injection once a year.  7. Omeprazole 20 mg once a day.  8. Norco 5 mg/325 milligrams every eight hours p.r.n.  9. Losartan 100 mg once a day. 10. Hydralazine 12.5 mg twice a day. 11. Gabapentin 100 mg twice a day. 12. Aspirin 81 mg a day.   ALLERGIES: Plavix and Advair Diskus.   PHYSICAL EXAMINATION:  VITAL SIGNS: Blood pressure 163/69, pulse 76, respiratory rate 18, temperature 97, oxygen 99% on oxygen.   GENERAL APPEARANCE: Elderly female, thin looking, lying in bed, sick-looking, appears lethargic but communicates well.   HEAD/NECK: Mild pallor. No icterus. No cyanosis.   ENT: Hearing was normal. Nasal mucosa, lips, tongue were normal. She is edentulous.   EYES: Normal iris and conjunctivae. Pupils about 4 mm, equal and sluggishly reactive to light.   NECK: Supple. Trachea at midline. No masses. No cervical lymphadenopathy.   HEART: Normal S1, S2. No S3 or S4. No gallop. No carotid bruits.   RESPIRATORY: Normal breathing pattern without use of accessory muscles. No rales. No wheezing.   ABDOMEN: Soft. There is mild to moderate tenderness at the suprapubic area. No rebound. No rigidity. No hepatosplenomegaly. No masses. No hernias.   SKIN: No ulcers. No subcutaneous nodules.   MUSCULOSKELETAL: No joint swelling. No clubbing.   NEUROLOGIC: Cranial nerves II through XII are intact. No focal motor deficit.   PSYCHIATRY: The patient is alert and oriented x3. Mood and affect were normal.   LABORATORY, DIAGNOSTIC, AND RADIOLOGICAL DATA: EKG showed normal sinus rhythm at rate of 63 per minute. Unremarkable EKG. Chest x-ray showed bilateral diffuse interstitial thickening likely consistent with chronic  interstitial disease. However, superimposed mild edema or pneumonitis cannot be ruled out. CT scan of the head showed no acute intracranial abnormality. CT scan of the abdomen showed there is mild relative bowel wall thickening involving the ascending colon. Serum glucose 126. B-type natriuretic peptide is 341, BUN 11, creatinine 0.8, sodium 143, potassium 2.8. Liver function tests were normal. CPK 56. Troponin less than 0.02. CBC showed white count 7,000, hemoglobin 11, hematocrit 36, platelet count 159. Urinalysis showed 16 white blood cells, +1 bacteria.   ASSESSMENT:  1. Near syncope.  2. Suprapubic and periumbilical abdominal pain associated with nausea likely secondary to her urinary tract infection.  3. Urinary tract infection.  4. Significant hypokalemia.  5. Abnormal finding of the ascending colon showing some mild thickening. This is nonspecific, may be colitis.  6. Status post recent fall this Sunday, but no serious injuries.  7. Coronary artery disease. 8. Hypertension. 9. Hyperlipidemia. 10. Peripheral neuropathy.  11. Gastroesophageal reflux disease.  12. Chronic obstructive pulmonary disease, home oxygen dependent on 2 liters.  13. Chronic anemia. 14. Cataract surgery. 15. Depression and anxiety. 16. History of breast cancer, status post left mastectomy. 17. History of appendectomy. 18. Hysterectomy. 19. Cholecystectomy.   PLAN:  1. Will admit the patient to the medical floor on telemetry to monitor for any arrhythmias.  2. Blood cultures x2, urine culture.  3. Start Rocephin 1 gram daily.  4.  Potassium replacement already started. 5. Pain control and nausea control.  6. I would like to mention that the patient has a LIVING WILL and she had appointed her daughter, Emilio AspenLinda Kinney, to have the power of attorney. The patient's CODE STATUS is FULL CODE.   TIME SPENT IN EVALUATING THIS PATIENT AND REVIEWING MEDICAL RECORDS: More than one hour.    ____________________________ Carney CornersAmir M. Rudene Rearwish, MD amd:ap D: 05/05/2012 01:24:32 ET T: 05/05/2012 08:12:02 ET JOB#: [id number]  cc: Carney CornersAmir M. Rudene Rearwish, MD, <Dictator> Danella PentonMark F. Miller, MD Karolee OhsAMIR Dala DockM Warda Mcqueary MD ELECTRONICALLY SIGNED 05/06/2012 5:28

## 2014-11-28 NOTE — Consult Note (Signed)
Chief Complaint:   Subjective/Chief Complaint Feels better. No further nausea or vomiting.   VITAL SIGNS/ANCILLARY NOTES: **Vital Signs.:   09-Oct-13 04:02   Vital Signs Type Routine   Temperature Temperature (F) 97.6   Celsius 36.4   Temperature Source Oral   Pulse Pulse 56   Respirations Respirations 18   Systolic BP Systolic BP 107   Diastolic BP (mmHg) Diastolic BP (mmHg) 51   Mean BP 69   Pulse Ox % Pulse Ox % 99   Pulse Ox Activity Level  At rest   Oxygen Delivery 2L   Lab Results: Routine Chem:  09-Oct-13 06:04    Lipase  42 (Result(s) reported on 19 May 2012 at 06:43AM.)   Assessment/Plan:  Assessment/Plan:   Assessment Nausea and vomiting resolved.    Plan Continue PPI as OP Follow up with GI as OP if needed. Will sign off. Please call me if needed. Thanks.   Electronic Signatures: Lurline DelIftikhar, Kaemon Barnett (MD)  (Signed 09-Oct-13 11:45)  Authored: Chief Complaint, VITAL SIGNS/ANCILLARY NOTES, Lab Results, Assessment/Plan   Last Updated: 09-Oct-13 11:45 by Lurline DelIftikhar, Davonne Jarnigan (MD)

## 2014-11-28 NOTE — Consult Note (Signed)
PATIENT NAME:  Pamela Cooper, KREHER MR#:  [id number] DATE OF BIRTH:  05/16/1925  DATE OF CONSULTATION:  05/19/2012  REFERRING PHYSICIAN:  Bethann Punches, MD CONSULTING PHYSICIAN:  Lurline Del, MD  REASON FOR CONSULTATION: Nausea and vomiting.   HISTORY OF PRESENT ILLNESS: This is an 81 year old female with end-stage chronic obstructive pulmonary disease. Apparently the patient was admitted 2 to 3 weeks ago with malignant hypertension and hypokalemia. The patient also had urinary tract infection which was treated with antibiotics. CT scan of the abdomen showed questionable mild colitis in the right colon. The patient was readmitted two days ago with nausea and vomiting for a couple of days. Apparently the patient has been having some nausea but no vomiting for the last couple of weeks. She started to throw up over the weekend and was subsequently admitted to the hospital. According to the patient, she had some fever as well and felt very weak and could not really walk. She was evaluated on the request of Dr. Bethann Punches yesterday. The patient was sitting up in her bed eating a regular diet. According to her, her nausea and vomiting has resolved and she is tolerating regular food without much problem. She denies any diarrhea. The patient has been afebrile since admission. She is chronically short of breath, but according to her the breathing is a little bit better. No other significant symptoms were reported by the patient.   PAST MEDICAL HISTORY:  1. Hypertension. 2. Gastroesophageal reflux disease. 3. End-stage chronic obstructive pulmonary disease, on home oxygen. 4. History of transient ischemic attacks.  5. Osteoporosis.  6. History of thoracic compression fractures.   PAST SURGICAL HISTORY:  1. Bilateral carotid endarterectomies.  2. Appendectomy.   ALLERGIES: Advair and Plavix.     HOME MEDICATIONS:  1. Oxygen 3 liters a minute. 2. Xanax 0.25 mg twice a day.  3. Aspirin 81 mg a  day. 4. Reglan 5 mg at bedtime. 5. Omeprazole 20 mg a day. 6. Zoloft 50 mg once a day.  7. Norco p.r.n.  8. Simvastatin 80 mg a day. 9. Azor. 10. Zofran. 11. MiraLax.   SOCIAL HISTORY: She is a widow. She does not smoke or drink.   FAMILY HISTORY: Unremarkable.   REVIEW OF SYSTEMS: Positive for chronic shortness of breath.   PHYSICAL EXAMINATION:   GENERAL: Elderly, chronically ill-appearing female who appears to be in mild to moderate respiratory distress. The patient is wearing her oxygen.   VITAL SIGNS: Vitals are fairly stable and she is afebrile.   PULMONARY: Lung examination showed very poor thoracic expansion and very poor air entry bilaterally.   CARDIOVASCULAR: Regular rate and rhythm.   ABDOMEN: Examination shows slightly distended abdomen. There is voluntary guarding, although there is no significant tenderness or rebound.   EXTREMITIES: No edema.   NEUROLOGIC: Examination appears to be grossly unremarkable.  LAB STUDIES: Her white cell count is 7.3, hemoglobin 11.2, hematocrit 34.7, and platelet count 172. Electrolytes: BUN and creatinine are normal. TSH is normal. Serum lipase is 42.   ASSESSMENT AND PLAN: The patient is with low-grade fever which has resolved. The patient also presented with some nausea and vomiting that has resolved as well. We may be dealing with an episode of viral gastroenteritis. The patient has had urinary tract infection recently and has used antibiotics which could also cause some of the symptoms mentioned above. Currently she seems to be fairly asymptomatic and tolerating her regular diet fairly well. An upper GI endoscopy is certainly an option,  although because of the patient's multiple medical problems, especially end-stage chronic obstructive pulmonary disease, an upper GI endoscopy is not without significant risk. This was discussed with the patient and her family members in detail. Due to the fact that the patient is currently  asymptomatic, we will continue to observe and defer any endoscopic procedure at this time. If the patient remains asymptomatic, she can follow up with gastroenterology as an outpatient. If the patient's symptoms recur, then the patient will probably require an upper gastrointestinal endoscopy as an inpatient. We will follow. Further recommendations to follow as well. Thank you so much to Dr. Hyacinth MeekerMiller.  ____________________________ Lurline DelShaukat Timber Lucarelli, MD si:slb D: 05/19/2012 08:33:25 ET T: 05/19/2012 09:12:12 ET JOB#: [id number]  cc: Lurline DelShaukat Malerie Eakins, MD, <Dictator> Danella PentonMark F. Miller, MD Lurline DelSHAUKAT Jonahtan Manseau MD ELECTRONICALLY SIGNED 06/01/2012 17:22

## 2014-11-28 NOTE — H&P (Signed)
PATIENT NAME:  [name], Jhaniya M MR#:  [id number] DATE OF BIRTH:  [birth year]  DATE OF ADMISSION:  05/17/2012  HISTORY OF PRESENT ILLNESS: The patient is a 81 year old female who presents with intractable nausea and vomiting. She was recently admitted a couple of weeks ago with hypokalemia and malignant hypertension that was replaced. Her blood pressure has been well controlled. Her urine infection was resolved, her potassium supplemented, and her nausea had gotten better; however, over the weekend she started having intractable nausea and vomiting, no diarrhea, but started to run a fever, was very weak, and cannot really even stand out of the wheelchair. She is just more out of it, the family is really not able to look after her. She is occasionally confused. With her fever, temperature 99.7, intractable nausea, vomiting and inability to stay home she will be admitted for further evaluation and treatment.   PAST MEDICAL HISTORY:  1. Hypertension.  2. Gastroesophageal reflux disease.  3. Chronic obstructive pulmonary disease/asthma.  4. History of transient ischemic attacks.  5. Osteoporosis.  6. History of thoracic compression fractures.   PAST SURGICAL HISTORY:  1. Bilateral carotid endarterectomies. 2. Right arthroscopic knee surgery. 3. Appendectomy.   ALLERGIES: Advair, Plavix.   OXYGEN: 3 liters daily.   MEDICATIONS:  1. Xanax 0.25 mg b.i.d.  2. Aspirin 81 mg daily.  3. Reglan 5 mg at bedtime.  4. Omeprazole 20 mg daily.  5. Zoloft 100 mg, 1/2 daily.  6. Norco 5/325, 1/2 to 1 t.i.d.  7. Simvastatin 80 mg at bedtime.  8. Azor 5/20 daily.  9. Zofran 4 mg t.i.d. p.r.n. nausea.  10. MiraLax daily.   SOCIAL HISTORY: Widowed. She lives with sister.   REVIEW OF SYSTEMS: Review of systems is otherwise negative.   PHYSICAL EXAMINATION:  VITAL SIGNS: Blood pressure 120/78, temperature 99.7, pulse 69, pulse oximetry 92% on 3 liters.   NECK: No thyromegaly or bruits.   LUNGS:  Clear.   HEART: Regular rhythm. No audible murmur.   ABDOMEN: Diffuse tenderness. No focality. No rebound.   EXTREMITIES: No edema.   NEUROLOGICAL: Significant ataxia, can barely stand out of the wheelchair without getting very lightheaded and nearly fainting.   ASSESSMENT AND PLAN:  1. Symptom complex: Nausea, vomiting, and fevers worrisome for possible urinary tract infection. We will check a urine culture, blood cultures, use empiric antibiotics. Infection could be coming from a bowel source versus urine source. GI thoughts are pending.  2. History of malignant hypertension: Doing well on the Azor.  3. Constipation/diarrhea: Currently on MiraLAX. We will add Flora-Q, hold simvastatin.  4. We will get Physical Therapy involved. She will need skilled nursing facility placement.  ____________________________ Danella PentonMark F. Benita Boonstra, MD mfm:cbb D: 05/17/2012 17:31:13 ET T: 05/17/2012 18:37:11 ET JOB#: [id number] Raife Lizer F Javid Kemler MD ELECTRONICALLY SIGNED 05/19/2012 7:49

## 2014-11-28 NOTE — Discharge Summary (Signed)
PATIENT NAME:  Pamela Cooper, Adriane M MR#:  [id number] DATE OF BIRTH:  12-29-1924  DATE OF ADMISSION:  05/17/2012 DATE OF DISCHARGE:  05/21/2012  DISCHARGE DIAGNOSES:  1. Acute colitis with fever.  2. Dehydration.  3. Hypertension, malignant.  4. Chronic obstructive pulmonary disease/asthma.  5. History of transient ischemic attacks.  6. History of thoracic compression fractures.   DISCHARGE MEDICATIONS:  1. Xanax 0.25 mg b.i.d.  2. Aspirin 81 mg daily.  3. Reglan 5 mg at bedtime.  4. Protonix 40 mg daily.  5. Zoloft 100 mg half daily.  6. Norco 5/325 half to 1 t.i.d.  7. Azor 5/20 daily.  8. MiraLax daily p.r.n.  9. Cipro 250 mg b.i.d. x4 days.  10. Flagyl 250 mg 3 times daily for four days.  11. 3 liters O2 nasal cannula.   REASON FOR ADMISSION: 81 year old female presents with abdominal pain, dehydration. Please see history and physical for history of present illness, past medical history, and physical exam.   HOSPITAL COURSE: Patient was admitted. Chest CT showed chronic obstructive pulmonary disease, but no untoward problem otherwise. She was placed on IV Cipro and Flagyl, IV Protonix added. Her diarrhea improved. Her nausea improved. She was aggressively hydrated. Strength improved. Initially she was thought to need to go to skilled nursing but her strength improved such that she was able to go home. She will be off simvastatin for now. Blood pressure jumped back up and the amlodipine was re-added. Overall she is doing well, hopefully she will stay that way. Bowels are stable. She will finish up the antibiotics. Will be on Protonix instead of omeprazole. Overall prognosis is guarded.  ____________________________ Danella PentonMark F. Delmus Warwick, MD mfm:cms D: 05/21/2012 08:13:07 ET T: 05/21/2012 13:21:57 ET JOB#: [id number]  cc: Danella PentonMark F. Laterrica Libman, MD, <Dictator>  Danella PentonMARK F Keana Dueitt MD ELECTRONICALLY SIGNED 05/24/2012 7:58

## 2014-12-02 NOTE — Discharge Summary (Signed)
PATIENT NAME:  Pamela HabermannROACH, Matayah M MR#:  [id number] DATE OF BIRTH:  [birth year]  DATE OF ADMISSION:  02/05/2014 DATE OF DISCHARGE:  02/09/2014  DISCHARGE DIAGNOSES: 1.  Acute T7 thoracic compression fracture, vertebral.  2.  Chronic obstructive pulmonary disease flare.  3.  Osteoporosis.  4.  Gastroesophageal reflux disease.  5.  Hemorrhoids. 6.  Hypertension.   DISCHARGE MEDICATIONS: 1.  DuoNeb SVN t.i.d. 2.  Aspirin 81 mg daily. 3.  Pantoprazole 40 mg daily.  4.  Mirtazapine 7.5 mg at bedtime. 5.  Zoloft 50 mg q. a.m.  6.  Amlodipine 5 mg daily.  7.  Xanax 0.25 mg b.i.d. 8.  Norco 5/325 q. 4. 9.  Tylenol 650 mg q. 4 p.r.n. 10.  Senna b.i.d. p.r.n.  11.  MiraLax 8.5 grams daily.   REASON FOR ADMISSION: An 81 year old female who presents with severe back pain and shortness of breath. Please see H and P for HPI, past medical history and physical exam.   HOSPITAL COURSE: The patient was admitted and treated aggressively with steroids and antibiotics. Her breathing improved. Her oxygenation was stable on her chronic O2. Due to persistent progressive back pain, a thoracic MRI was performed showing a T7 compression fracture. She is not a surgical candidate for kyphoplasty in regard to her end-stage COPD. She will be pain managed. She cannot tolerate Fosamax due to severe GERD. Overall prognosis is guarded and will be going to skilled placement.   ____________________________ Danella PentonMark F. Miller, MD mfm:sb D: 02/09/2014 07:53:10 ET T: 02/09/2014 11:19:45 ET JOB#: [id number]  cc: Danella PentonMark F. Miller, MD, <Dictator> MARK Sherlene ShamsF MILLER MD ELECTRONICALLY SIGNED 02/13/2014 8:15

## 2014-12-02 NOTE — H&P (Signed)
PATIENT NAME:  Pamela Cooper, Pamela Cooper MR#:  [id number] DATE OF BIRTH:  1925/07/12  DATE OF ADMISSION:  11/19/2013  REFERRING PHYSICIAN: Dr. Governor Rooks  PRIMARY CARE PHYSICIAN: Dr. Bethann Punches at [facility].   CHIEF COMPLAINT: Nausea, vomiting, diarrhea.  HISTORY OF PRESENT ILLNESS: An 81 year old Caucasian female with history of COPD 3 liters nasal cannula at baseline, hypertension, esophageal stricture status post dilatation back in 2013, presenting with nausea, vomiting, and diarrhea. She describes a 1-day duration of nausea, vomiting, diarrhea, multiple bouts of nonbloody, nonbilious emesis with associated p.o. intolerance as well as multiple bouts of diarrhea described as loose stools. She denies any fevers, chills, recent sick contacts, abdominal pain. She mentions generalized weakness after all these bouts but complains of shortness of breath as well which is chronic and states that it is essentially the same as usual.   REVIEW OF SYSTEMS:  CONSTITUTIONAL: Positive for fatigue, weakness. Denies fevers, chills.  EYES: Denies blurry, double vision, or eye pain.  HEENT: Denies tinnitus, ear pain, hearing loss.  RESPIRATORY: Positive for shortness of breath that is chronic, denies any hemoptysis or coughing.  CARDIOVASCULAR: Denies chest pain, palpitations, edema.  GASTROINTESTINAL: Positive for nausea, denies vomiting, diarrhea. Denies abdominal pain.  GENITOURINARY: Denies dysuria or hematuria.  ENDOCRINE: Denies nocturia or thyroid problems.  HEMATOLOGIC AND LYMPHATIC: Positive for easy bruising. Denies bleeding. SKIN: Denies rash or lesions. MUSCULOSKELETAL: Denies pain in neck, back, shoulders, knees, hips, or arthritic symptoms.  NEUROLOGIC: Denies problems with paresthesias.  PSYCHIATRIC: Denies anxiety or depressive symptoms.   Otherwise, full review of systems performed by me is negative.   PAST MEDICAL HISTORY: Hypertension, COPD 3 liters nasal cannula on baseline, as well as  TIA.    SOCIAL HISTORY: Remote tobacco abuse. Denies alcohol or drug use. She lives with her sister.   FAMILY HISTORY: Positive for coronary artery disease as well as diabetes.   ALLERGIES: ADVAIR AND PLAVIX.   HOME MEDICATIONS: Include aspirin 81 mg p.o. daily, Norco 5/325 one tablet every 4 hours as needed for pain, Zoloft 50 mg p.o. at bedtime, Reglan 5 mg p.o. at bedtime, Azor 5/20 mg p.o. daily, Xanax 0.25 mg p.o. b.i.d., formoterol 20 mcg inhalation b.i.d., MiraLax daily as needed for constipation, potassium 10 mEq p.o. b.i.d., pantoprazole 40 mg p.o. daily, vitamin D3 at 1000 international units p.o. daily.   PHYSICAL EXAMINATION:  VITAL SIGNS: Temperature 98.2, heart rate 106, respirations 28, blood pressure 152/65, saturating 99% on supplemental O2. Weight 59 kg, BMI 21.6.  GENERAL: Chronically ill and frail appearing Caucasian female, currently in no acute distress.  HEAD: Normocephalic, atraumatic.  EYES: Pupils equal, round, reactive to light. Extraocular muscles intact. No scleral icterus.  MOUTH: Dry mucosal membranes. Dentition poor. No abscess noted.  EARS, NOSE, AND THROAT: Clear without exudates. No external lesions.  NECK: Supple. No thyromegaly. No nodules. No JVD.  PULMONARY: Grossly diminished breath sounds throughout all lung fields. No wheezes, rubs, or rhonchi. Poor respiratory effort.  CHEST:  Nontender on palpation.  CARDIOVASCULAR: S1, S2, tachycardic, no murmurs, rubs, or gallops. No edema. Pedal pulses 2+ bilaterally.  GASTROINTESTINAL: Soft, nontender, nondistended. No masses. Positive bowel sounds. No hepatosplenomegaly.  MUSCULOSKELETAL: No swelling, clubbing, or edema. Range of motion is full in all extremities. NEUROLOGIC: Cranial nerves II-XII intact. No gross neurologic deficits. Sensation intact. Reflexes intact.  SKIN: Multiple areas of ecchymoses. However, no ulcerations, further lesions, rashes, or cyanosis. Skin warm and dry. Turgor intact.   PSYCHIATRIC: Mood and affect within normal limits.  The patient was awake and alert and oriented x 3. Insight and judgment are intact.   LABORATORY DATA: CT abdomen and pelvis performed with contrast revealing no acute abdominal process.   Remainder of laboratory data: Sodium 137, potassium 3.6, chloride 102, bicarbonate 33, BUN 11, creatinine 0.72, glucose 178, lipase 40. LFTs within normal limits. WBC 18, hemoglobin 11.9, platelets of 183,000. Urinalysis negative for evidence of infection.   ASSESSMENT AND PLAN: An 81 year old Caucasian female with a history of chronic obstructive pulmonary disease, presenting with nausea, vomiting, diarrhea. 1. Systemic inflammatory response syndrome, meets criteria by heart rate, respiratory rate, no leukocytosis. No acute evidence for bacterial infection at this time. Will hold on antibiotics. However, we will check Clostridium difficile given history of diarrhea. If positive will require antibiotic therapy as she has not been on any antibiotics in the recent past, making this somewhat less likely.  2. Intractable nausea or vomiting. She does have a history of esophageal stricture, however, this would not cause any of her diarrhea so this is likely gastroenteritis. We will provide supportive care, IV fluid hydration with normal saline. Clears for a diet. Advance as tolerated. Once again, her Clostridium difficile is pending. She has received Zofran 4 times and still nauseous, thus will switch to Phenergan.  3. Chronic obstructive pulmonary disease. Continue her home nebulizer treatments as well as supplemental oxygen to keep oxygen saturation greater than 92%.  4. Gastroesophageal reflux disease. Continue with her PPI therapy.  5. Venous thromboembolism prophylaxis with heparin subcutaneous.   CODE STATUS: The patient is DO NOT RESUSCITATE, as discussed patient and family at bedside.   TIME SPENT: 45 minutes.    ____________________________ Cletis Athensavid K. Hower,  MD dkh:lt D: 11/19/2013 23:09:55 ET T: 11/20/2013 00:37:21 ET JOB#: [id number]  cc: Cletis Athensavid K. Hower, MD, <Dictator> DAVID Synetta ShadowK HOWER MD ELECTRONICALLY SIGNED 11/20/2013 3:37

## 2014-12-02 NOTE — Consult Note (Signed)
Brief Consult Note: Diagnosis: Acute T7 compression fracture.   Patient was seen by consultant.   Consult note dictated.   Comments: Patient is a pleasant 81 year old female admitted for COPD exacerbation.  She complained of thoracic back pain and has been found to have an acute T7 compression fracture.  I recommend medical management of her pain.  If the pain continues to be severe, Dr. Rosita KeaMenz or Dr. Laban EmperorNaveira can be consulted for a possible kyphoplasty.  Electronic Signatures: Juanell FairlyKrasinski, Dontay Harm (MD)  (Signed 01-Jul-15 21:01)  Authored: Brief Consult Note   Last Updated: 01-Jul-15 21:01 by Juanell FairlyKrasinski, Avamae Dehaan (MD)

## 2014-12-02 NOTE — Consult Note (Signed)
PATIENT NAME:  Pamela Cooper, Pamela Cooper MR#:  [id number] DATE OF BIRTH:  02/07/1925  DATE OF CONSULTATION:  02/08/2014  REFERRING PHYSICIAN:   CONSULTING PHYSICIAN:  Kathreen DevoidKevin L. Donia Yokum, MD  REASON FOR CONSULTATION: Thoracic back pain.   HISTORY:  This patient is a pleasant 81 year old female with a history of COPD.  She was admitted for exacerbation of her COPD and thoracic back pain times 3-4 days.   PAST MEDICAL HISTORY: Includes hypertension, hyperlipidemia, COPD and is home O2 dependent, history of TIA, osteoporosis, osteoarthritis, history of myocardial infarction, previous thoracic compression fracture, depression, shingles, GERD, history of breast cancer, peptic ulcer disease, and hemorrhoids.   PAST SURGICAL HISTORY: Includes a left mastectomy, right total knee replacement, hysterectomy, cholecystectomy, appendectomy, carpal tunnel release and carotid endarterectomy.   ALLERGIES: INCLUDE PHENERGAN, PLAVIX, ADVAIR DISKUS, AND ADVAIR.   HOME MEDICATIONS: INCLUDE SERTRALINE, PROTONIX, ZOFRAN, MIRTAZAPINE, ASPIRIN, AMLODIPINE, ALPRAZOLAM, COMBIVENT, AND PERCOCET.   SOCIAL HISTORY: The patient is a former smoker. She currently lives with her daughter.   PHYSICAL EXAMINATION: On examination today, patient had point tenderness of the thoracic spine at approximately the T7 level. There was a kyphotic deformity to the thoracic spine. The skin was intact and there are no signs of trauma including erythema or ecchymosis or swelling. The patient distally was neurovascularly intact and had intact sensation to light touch in both lower extremities. She had palpable pedal pulses. She can flex and extend her toes and dorsiflex and plantar flex her ankle. She had intact quadriceps and hamstring function as well.   RADIOLOGY: An MRI of the thoracic spine showed increased edema within T7. There is approximately a 50% compression without retropulsion on the MRI. There is no compression of the cord. The patient has  diffuse degenerative changes.   ASSESSMENT: Acute T7 compression fracture.   PLAN: This patient has had previous compression fractures. She explains that these were treated medically with pain medication.  At this point, I recommend that we treat her symptomatically with analgesics as needed. If her pain continues unabated then I would recommend contacting either Dr. Rosita KeaMenz or Dr. Laban EmperorNaveira who could perform a kyphoplasty for this patient if necessary. The patient understood and agreed with this plan.    ____________________________ Kathreen DevoidKevin L. Holten Spano, MD klk:dd D: 02/08/2014 21:05:59 ET T: 02/09/2014 02:20:48 ET JOB#: [id number]  cc: Kathreen DevoidKevin L. Meliyah Simon, MD, <Dictator> Kathreen DevoidKEVIN L Rut Betterton MD ELECTRONICALLY SIGNED 02/16/2014 12:35

## 2014-12-02 NOTE — Discharge Summary (Signed)
PATIENT NAME:  Pamela Cooper, Pamela Cooper MR#:  [id number] DATE OF BIRTH:  01-23-1925  DATE OF ADMISSION:  11/19/2013 DATE OF DISCHARGE:  11/22/2013   DISCHARGE DIAGNOSES: 1. Viral gastroenteritis with dehydration.  2. Chronic respiratory failure secondary to chronic obstructive pulmonary disease.  3. Atherosclerotic cardiovascular disease.  4. Osteoarthritis.   DISCHARGE MEDICATIONS:  1. Aspirin 81 mg daily.  2. Norco 5/325 mg 1 q.4 hours p.r.n. pain.  3. Zoloft 50 mg at bedtime.  4. Reglan 5 mg at bedtime.  5. Azor 5/20 q.a.Cooper.  6. Xanax 0.25 mg b.i.d.  7. Formoterol 20 mcg b.i.d.  8. DuoNeb SVN q.4 p.r.n. 9. MiraLax p.r.n. 10. Potassium chloride 10 mEq b.i.d.  11. Pantoprazole 40 mg daily.  12. Vitamin D3 1000 units daily.   REASON FOR ADMISSION: An 81 year old female presents with diarrhea and volume depletion. Please see H and P for HPI, past medical history and physical exam.   HOSPITAL COURSE: The patient was admitted, hydrated. Her diarrhea improved with hydration and time. This is most consistent with a viral etiology. C. difficile was negative. She had significant arthritic pain that resolved with Solu-Medrol x1. There is some concern about needing skilled nursing, but with the Solu-Medrol and therapy, she was able to get up and around and will use home health physical therapy. She will continue on her usual medications at home.   OVERALL PROGNOSIS: Guarded.    ____________________________ Danella PentonMark F. Miller, MD mfm:lb D: 11/22/2013 07:44:58 ET T: 11/22/2013 08:06:00 ET JOB#: [id number]  cc: Danella PentonMark F. Miller, MD, <Dictator> MARK Sherlene ShamsF MILLER MD ELECTRONICALLY SIGNED 11/22/2013 8:26

## 2014-12-02 NOTE — H&P (Signed)
PATIENT NAME:  Pamela Cooper, Manvir M MR#:  [id number] DATE OF BIRTH:  August 28, 1924  DATE OF ADMISSION:  02/05/2014  PRIMARY CARE PHYSICIAN:  Dr. Bethann PunchesMark Miller  REFERRING PHYSICIAN:  Dr. Lowella FairyJohn Woodruff  CHIEF COMPLAINT:  Shortness of breath.  HISTORY OF PRESENT ILLNESS:  This patient is an 81 year old female with a past medical history of multiple medical problems including COPD, chronic respiratory failure on 3 liters of oxygen, hypertension, hyperlipidemia, history of compression fractures of the thoracic vertebrae, comes to the Emergency Department with complaints of shortness of breath, started for the last 3-4 days.  Patient states they started as a pain in the back which is radiating to the front and followed by cough with a gradual worsening of the shortness of breath.  Concerning this, patient is brought to the Emergency Department.  Workup in the Emergency Department with chest x-ray does not show any infiltrates.  Patient states has been having wet cough, however unable to bring up.  Denies having any fever.  Patient at baseline is able to walk to the living room and to the kitchen; however, has been experiencing severe shortness of breath even with mild exertion.  In the Emergency Department, patient received levofloxacin, breathing treatments, and Solu-Medrol.  PAST MEDICAL HISTORY:   1. Hypertension.  2. Hyperlipidemia.  3. COPD, oxygen dependent. 4. Previous history of TIA.  5. Osteoporosis. 6. Osteoarthritis.  7. History of MI. 8. Thoracic compression fracture. 9. Depression.  10. Shingles.  11. Gastroesophageal reflux disease. 12. History of breast cancer. 13. Peptic ulcer disease. 14. Hemorrhoids.   PAST SURGICAL HISTORY:  1. Left mastectomy. 2. Right total knee replacement.  3. Hysterectomy. 4. Cholecystectomy. 5. Appendectomy. 6. Carpal tunnel release.  7. Carotid endarterectomy.  ALLERGIES: 1. PHENERGAN.  2. PLAVIX.  3. ADVAIR DISKUS.  4. ADVAIR.  HOME MEDICATIONS:   1. Sertraline 50 mg once a day. 2. Protonix 40 mg 2 times a day. 3. Zofran 4 mg 3 times a day. 4. Mirtazapine 7.5 mg once a day. 5. Aspirin 81 mg once a day. 6. Amlodipine 5 mg once a day. 7. Alprazolam 0.25 mg 2 times a day. 8. Combivent as needed. 9. Percocet 5/325 mg 4 times a day as needed.  SOCIAL HISTORY: Former smoker.  Denies drinking alcohol or using illicit drugs.  Currently, lives with her daughter.   FAMILY HISTORY:  Positive for coronary artery disease and diabetes mellitus.  REVIEW OF SYSTEMS:  CONSTITUTIONAL: Experiences generalized weakness. EYES:  No change in vision. EARS, NOSE, AND THROAT:  No change in hearing. RESPIRATORY:  Has cough, shortness of breath. CARDIOVASCULAR: No chest pain, palpitations. GASTROINTESTINAL: No nausea, vomiting, abdominal pain. GENITOURINARY:  No dysuria, hematuria. HEMATOLOGY: No easy bruising or bleeding. SKIN:  No rash or lesions. MUSCULOSKELETAL:  Has osteoarthritis and back pain. NEUROLOGICAL:  No weakness or numbness in any part of the body.  PHYSICAL EXAMINATION: GENERAL: This is a thin built, cachectic-looking female lying down in the bed in mild distress secondary to shortness of breath. VITAL SIGNS: Temperature 97.6, pulse 103, blood pressure 114/70, respiratory rate of 24, oxygen saturation is 88% on 2 liters of oxygen. HEENT:  Head normocephalic, atraumatic.  There is no scleral icterus.  Conjunctivae normal.  Pupils equal and react to light.  Extraocular movements are intact.  Mucous membranes moist.  No pharyngal erythema.   NECK:  Supple.  No lymphadenopathy. No JVD.  No carotid bruit.  No thyromegaly. CHEST:  Has no focal tenderness.  Has prolonged expiratory phase  bilaterally.   HEART:  S1, S2 regular.  Tachycardia.  ABDOMEN: Bowel sounds present.  Soft, nontender, nondistended. EXTREMITIES:  No pedal edema.  Pulses 2+. SKIN:  No rash or lesions. MUSCULOSKELETAL:  Good range of motion in all  extremities. NEUROLOGICAL:  Patient is alert, oriented to place, person, and time.  Cranial nerves II-XII intact.  Motor 5/5 in upper and lower extremities.  LABORATORY DATA:  CBC is completely within normal limits.  CMP is completely within normal limits. Troponin less than 0.02.  ABG:  pH of 7.38, pCO2 of 51, pO2 of 81.  ASSESSMENT AND PLAN:  This patient is an 81 year old female who comes to the Emergency Department with chronic obstructive pulmonary disease exacerbation. 1. Chronic obstructive pulmonary disease exacerbation.  Continue with the breathing treatments, Solu-Medrol.  Keep the patient on Rocephin and Zithromax. 2. Debility.  We will involve physical therapy, occupational therapy. 3. Thoracic compression fractures.  Continue with pain management as needed. 4. Keep the patient on deep venous thrombosis prophylaxis with Lovenox.   TIME SPENT:  60 minutes.    ____________________________ Susa Griffins, MD pv:dd D: 02/05/2014 23:55:54 ET T: 02/06/2014 01:58:39 ET JOB#: [id number]  cc: Susa Griffins, MD, <Dictator> Danella Penton, MD Clerance Lav VASIREDDY MD ELECTRONICALLY SIGNED 02/16/2014 0:26

## 2014-12-03 NOTE — H&P (Signed)
PATIENT NAME:  Pamela HabermannROACH, [name] MR#:  Cooper DATE OF BIRTH:  05/26/1925  DATE OF ADMISSION:  09/11/2011  REFERRING PHYSICIAN: Rockne MenghiniAnne-Caroline Norman, MD    PRIMARY CARE PHYSICIAN: Bethann PunchesMark Miller, MD   PRESENTING COMPLAINT: Chest pain.   HISTORY OF PRESENT ILLNESS: Pamela Cooper is a pleasant 81 year old woman with history of coronary disease/myocardial infarction, hypertension, hyperlipidemia, chronic obstructive pulmonary disease-on chronic oxygen, depression and anxiety, who presents with reports of developing chest pain three days ago that has been progressively worsening to the point where she is not able to bear the pain. She endorses generalized weakness, no palpitations. She had evaluation for syncope back in December of 2012 thought to be related to Norvasc. She reports no further episodes of syncope but does have some dizziness and lightheadedness. She reports developing nausea today, describes her pain on the left side with radiation to the back and down her left arm. She has worsening pain with movement and with deep inspiration. The patient was last seen in June of [id number] complaints of chest pain but reports that being on her right shoulder and at that time was thought to be related to shoulder impingement syndrome. The patient had a trigger point injection with improvement in her symptoms at that time.   PAST MEDICAL HISTORY:  1. Coronary artery disease/myocardial infarction.  2. Hypertension.  3. Gastroesophageal reflux disease.  4. Chronic obstructive pulmonary disease on chronic oxygen 2 liters at rest and 3 liters on exertion.  5. Chronic anemia.  6. Depression/anxiety.  7. Cataracts.  8. Peripheral vascular disease, status post bilateral CEA.  9. Hyperlipidemia.  10. Idiopathic peripheral neuropathy.  11. History of shingles.  12. Transient ischemic attack.  13. Breast cancer, status post left mastectomy.   PAST SURGICAL HISTORY:  1. Bilateral CEA.  2. Appendectomy.   3. Hysterectomy.  4. Cholecystectomy.  5. Carpal tunnel repair x2.  6. Right knee replacement.  7. History of esophageal dilatation with history of foreign body removal via EGD.   ALLERGIES: Advair and Plavix.   MEDICATIONS:  1. Buspirone 15 mg daily as needed.  2. Oxygen 2 liters continuous.  3. Xanax 0.25 mg b.i.d.  4. Zoloft 50 mg daily.  5. Losartan 100 mg daily.  6. Aspirin 81 mg daily.  7. Reglan 5 mg at bedtime.  8. Omeprazole 20 mg daily.   SOCIAL HISTORY: She lives in [place] with her sister. She quit tobacco 30 years ago. She does not drink alcohol. The patient ambulates with a walker, but her daughter reports she typically uses a wheelchair.   FAMILY HISTORY: Coronary artery disease, cerebrovascular accident, diabetes. Her sister died of leukemia. One brother died of metastatic prostate cancer.   REVIEW OF SYSTEMS: CONSTITUTIONAL: No fevers. She endorses chills. Endorses nausea. EYES: Cataracts. ENT: Reports dizziness. No epistaxis or discharge. RESPIRATORY: She has chronic dry cough. No wheezing or hemoptysis. She endorses shortness of breath at baseline. CARDIOVASCULAR: As per history of present illness. GI: She endorses nausea today. No vomiting. Reports chronic diarrhea on and off. No abdominal pain, hematemesis, or melena. GU: No dysuria or hematuria. ENDOCRINE: No polyuria or polydipsia. HEMATOLOGIC: She has easy bruising. SKIN: No ulcers. MUSCULOSKELETAL: Denies any neck pain or back pain. Reports chronic feet pain. NEUROLOGIC: No one-sided weakness or numbness. She has generalized weakness. PSYCHIATRIC: Endorses depression, anxiety, no suicidal ideation.   PHYSICAL EXAMINATION:  VITAL SIGNS: Temperature 96.2, pulse 78, respiratory rate 22, blood pressure 181/74, saturations 99% on 4 liters.   GENERAL: Lying in  bed in no apparent distress. She is in discomfort due to the pain.   HEENT: Normocephalic, atraumatic. Pupils are equal and symmetric. Nasal cannula in  place. She has dry mucous membranes.   NECK: Soft and supple. No adenopathy. No JVP.   CARDIOVASCULAR: Nontachycardic. No murmurs, rubs, or gallops.   LUNGS: No wheezing or crackles. No use of accessory muscles or increased respiratory effort.   ABDOMEN: Soft. Positive bowel sounds. No mass appreciated. Some reproducible pain on palpation of her chest wall.   EXTREMITIES: No edema. Dorsal pedis pulses intact.   MUSCULOSKELETAL: No joint effusion.   SKIN: She has scant ecchymosis.   NEUROLOGICAL: Symmetrical squeeze of upper extremities. No dysarthria or aphasia.   PSYCHIATRIC: Alert and oriented. The patient is cooperative. She does have difficulty hearing.  PERTINENT LABORATORY, DIAGNOSTIC AND RADIOLOGICAL DATA:  Urinalysis with specific gravity of 1.02, pH of 5, RBC 1 per high-power field, WBC 1 per high-power field.  CT chest for PE shows severe emphysematous changes. No evidence of PE.  INR 0.9, PTT 23. WBC 6.4, hemoglobin 11.3, hematocrit 35.4, platelets 185, MCV 81.  Glucose 102, BUN 8, creatinine 0.76, sodium 144, potassium 3.9, chloride 104, carbon dioxide 32, calcium 8.8. LFTs within normal limits.  Troponin less than 0.02.  EKG with sinus rate of 72. No ST elevation or depression. There is T wave inversion in aVR that is  nonspecific.  Chest x-ray without any acute findings.   ASSESSMENT AND PLAN: Pamela Cooper is an 81 year old woman with history of coronary disease, myocardial infarction, chronic obstructive pulmonary disease on chronic oxygen, hypertension, hyperlipidemia, depression, anxiety, peripheral neuropathy, presenting with worsening chest pain.   1. Atypical chest pain: Her first troponin is negative. EKG without ischemia. More than likely this is noncardiac; however, given her multiple risk factors and she continues to have pain and worsening at home. We will go ahead and obtain a Myoview since she has not had that done of recent. Will continue to cycle cardiac  enzymes, start her on nitroglycerin sublingual as needed, continue oxygen and morphine as needed. Start her on low-dose beta blocker, aspirin. Send TSH, fasting lipid panel, A1c for risk stratification.  2. Chronic obstructive pulmonary disease-on chronic oxygen: Stable, SVNs as needed.  3. Hypertension, uncontrolled: She recently had Norvasc held, thought to induce hypotensive episodes of syncope. Continue to hold Norvasc but, as above, follow on the addition of beta blocker and restart her losartan.  4. Prophylaxis: Aspirin, omeprazole and Lovenox.   TIME SPENT: Approximately 35 minutes spent on patient care.   ____________________________ Reuel Derby, MD ap:cbb D: 09/12/2011 00:11:00 ET T: 09/12/2011 10:08:24 ET JOB#: [id number]  cc: Pearlean Brownie Breanne Olvera, MD, <Dictator> Danella Penton, MD Reuel Derby MD ELECTRONICALLY SIGNED 10/07/2011 0:32

## 2014-12-03 NOTE — Discharge Summary (Signed)
PATIENT NAME:  Pamela Cooper, Pamela Cooper MR#:  [id number] DATE OF BIRTH:  10/02/1924  DATE OF ADMISSION:  09/12/2011 DATE OF DISCHARGE:  09/12/2011  DISCHARGE DIAGNOSES:  1. Atypical chest pain secondary to thoracic impingement.  2. Coronary artery disease.  3. Hypertension.  4. Chronic obstructive pulmonary disease on chronic O2.  5. Idiopathic peripheral neuropathy.  6. Peripheral vascular disease.   DISCHARGE MEDICATIONS:  1. BuSpar 15 mg b.i.d.  2. 2 liters O2 nasal cannula.  3. Xanax 0.25 mg b.i.d.  4. Prednisone taper.  5. Zoloft 50 mg daily.  6. Losartan 100 mg daily.  7. Aspirin 81 mg daily.  8. Reglan 5 mg at bedtime.  9. Omeprazole 20 mg daily.   REASON FOR ADMISSION: [age] year old female presents with chest pain. Please see history and physical for history of present illness, past medical history, and physical exam.   HOSPITAL COURSE: The patient was admitted, ruled out for myocardial infarction. Chest CT showed bullous changes. Stress Lexiscan showed no signs of ischemia. Cortisone seemed to help the best. Her symptoms and exam were most consistent with thoracic impingement. She was asymptomatic on discharge and will follow up with Dr. Hyacinth MeekerMiller as scheduled.  ____________________________ Danella PentonMark F. Hamilton Marinello, MD mfm:ap D: 09/12/2011 17:12:28 ET              T: 09/14/2011 14:27:07 ET                    JOB#: [id number] cc: Danella PentonMark F. Rasheena Talmadge, MD, <Dictator> Sunshine Mackowski Sherlene ShamsF Lizania Bouchard MD ELECTRONICALLY SIGNED 09/17/2011 8:11
